# Patient Record
Sex: Male | Born: 1980 | Race: White | Hispanic: No | Marital: Married | Smoking: Never smoker
Health system: Southern US, Community
[De-identification: ages and names within clinical notes are randomized; demographics above are authoritative.]

## PROBLEM LIST (undated history)

## (undated) DIAGNOSIS — Z8669 Personal history of other diseases of the nervous system and sense organs: Secondary | ICD-10-CM

## (undated) DIAGNOSIS — E785 Hyperlipidemia, unspecified: Secondary | ICD-10-CM

## (undated) HISTORY — DX: Hyperlipidemia, unspecified: E78.5

## (undated) HISTORY — DX: Personal history of other diseases of the nervous system and sense organs: Z86.69

---
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
## 2017-07-11 LAB — LIPID PANEL
Cholesterol: 155 (ref 0–200)
HDL: 28 — AB (ref 35–70)
LDL Cholesterol: 101
Triglycerides: 130 (ref 40–160)

## 2017-07-11 LAB — HEMOGLOBIN A1C: Hemoglobin A1C: 5

## 2017-07-11 LAB — TESTOSTERONE: Testosterone: 253

## 2017-07-11 LAB — TESTOSTERONE, FREE: Free Testosterone: 6.7

## 2017-07-11 LAB — TSH: TSH: 2.59 (ref <=5.90)

## 2017-07-16 LAB — LUTEINIZING HORMONE: LH: 4.5

## 2017-07-16 LAB — CHG GONADOTROPIN FOLLICLE STIMULATING HORMONE: FSH: 6.2

## 2018-07-15 ENCOUNTER — Encounter: Payer: Self-pay | Admitting: Family Medicine

## 2018-07-15 ENCOUNTER — Other Ambulatory Visit: Payer: Self-pay

## 2018-07-15 ENCOUNTER — Ambulatory Visit: Payer: BC Managed Care – PPO | Admitting: Family Medicine

## 2018-07-15 VITALS — BP 122/84 | HR 80 | Temp 99.2°F | Resp 14 | Ht 71.5 in | Wt 252.5 lb

## 2018-07-15 DIAGNOSIS — H66015 Acute suppurative otitis media with spontaneous rupture of ear drum, recurrent, left ear: Secondary | ICD-10-CM | POA: Diagnosis not present

## 2018-07-15 MED ORDER — CIPROFLOXACIN HCL 0.2 % OT SOLN: 0.2000 mL | Freq: Two times a day (BID) | OTIC | 0 refills | Status: AC

## 2018-07-15 MED ORDER — CEFDINIR 300 MG PO CAPS: 600.0000 mg | ORAL_CAPSULE | Freq: Every day | ORAL | 0 refills | Status: DC

## 2018-07-15 NOTE — Progress Notes (Signed)
Subjective:     Thomas Weiss is a 38 y.o. male presenting for Establish Care (was seen PCP at [facility] but no longer can.) and Ear Pain (started on 07/10/2018. left ear. Went to [facility] on 07/12/2018 and was prescribed Augmentin. Pain is worse and having discharge now. No fever.)     Otalgia   There is pain in the left ear. This is a new problem. The current episode started in the past 7 days. The problem occurs constantly. The problem has been gradually worsening. There has been no fever. The pain is moderate. Associated symptoms include ear discharge (clear), headaches and hearing loss. Pertinent negatives include no abdominal pain, coughing, diarrhea, neck pain, rhinorrhea, sore throat or vomiting. He has tried antibiotics, heat packs, NSAIDs and acetaminophen for the symptoms. The treatment provided mild relief. ear infections as a child      Review of Systems  HENT: Positive for ear discharge (clear), ear pain and hearing loss. Negative for rhinorrhea and sore throat.   Respiratory: Negative for cough.   Gastrointestinal: Negative for abdominal pain, diarrhea and vomiting.  Musculoskeletal: Negative for neck pain.  Neurological: Positive for headaches.     Social History   Tobacco Use  Smoking Status Never Smoker  Smokeless Tobacco Never Used        Objective:    BP Readings from Last 3 Encounters:  07/15/18 122/84   Wt Readings from Last 3 Encounters:  07/15/18 252 lb 8 oz (114.5 kg)    BP 122/84   Pulse 80   Temp 99.2 F (37.3 C)   Resp 14   Ht 5' 11.5" (1.816 m)   Wt 252 lb 8 oz (114.5 kg)   SpO2 98%   BMI 34.73 kg/m    Physical Exam Constitutional:      General: He is not in acute distress.    Appearance: He is well-developed. He is not ill-appearing.  HENT:     Head: Normocephalic and atraumatic.     Right Ear: Ear canal normal. Tympanic membrane is scarred.     Left Ear: Ear canal normal. Drainage and tenderness present.   Ears:     Comments: Left ear canal with erythema. Drainage in the ear canal. Possible perforated ear drum, but partially obstructed by pus collection and exam was uncomfortable.     Nose: Mucosal edema and rhinorrhea present.     Right Sinus: No maxillary sinus tenderness or frontal sinus tenderness.     Left Sinus: No maxillary sinus tenderness or frontal sinus tenderness.     Mouth/Throat:     Pharynx: Uvula midline. Posterior oropharyngeal erythema present. No oropharyngeal exudate.     Tonsils: 0 on the right. 0 on the left.  Neck:     Musculoskeletal: Neck supple.  Cardiovascular:     Rate and Rhythm: Normal rate and regular rhythm.     Heart sounds: No murmur.  Pulmonary:     Effort: Pulmonary effort is normal. No respiratory distress.     Breath sounds: Normal breath sounds.  Lymphadenopathy:     Cervical: No cervical adenopathy.  Skin:    General: Skin is warm and dry.     Capillary Refill: Capillary refill takes less than 2 seconds.  Neurological:     Mental Status: He is alert.           Assessment & Plan:   Problem List Items Addressed This Visit      Nervous and Auditory  Acute suppurative otitis media with spontaneous rupture of ear drum, recurrent, left ear - Primary    No improvement on Augmentin will change to cefdinir. Suspect likely perforated ear drum with otitis externa given erythema and tenderness. Drops also prescribed. Discussed avoiding water and could return for recheck if planning to do swimming in the summer.       Relevant Medications   amoxicillin-clavulanate (AUGMENTIN) 875-125 MG tablet   cefdinir (OMNICEF) 300 MG capsule   Ciprofloxacin HCl 0.2 % otic solution       Return if symptoms worsen or fail to improve.  Lynnda Child, MD

## 2018-07-15 NOTE — Patient Instructions (Signed)
Otitis media with likely external ear infection as well  Use a cottonball coated in Vaseline when you shower to avoid getting extra water in the ear until it is improved. Would also avoid swimming or underwater activity for a few weeks.

## 2018-07-15 NOTE — Assessment & Plan Note (Signed)
No improvement on Augmentin will change to cefdinir. Suspect likely perforated ear drum with otitis externa given erythema and tenderness. Drops also prescribed. Discussed avoiding water and could return for recheck if planning to do swimming in the summer.

## 2018-07-23 ENCOUNTER — Telehealth: Payer: Self-pay

## 2018-07-23 NOTE — Telephone Encounter (Signed)
Left message for patient to call back. Received reply from [facility] office in regards to our request for records and they sent over their own forms to be filled out and signed. Need to see if patient can come by at some point and do this if able.

## 2018-07-27 NOTE — Telephone Encounter (Signed)
Pt returned call

## 2018-07-29 NOTE — Telephone Encounter (Signed)
Spoke with Thomas Weiss.  He will stop by office today to sign record release.

## 2018-09-02 ENCOUNTER — Ambulatory Visit: Payer: BC Managed Care – PPO | Admitting: Family Medicine

## 2018-09-02 ENCOUNTER — Other Ambulatory Visit: Payer: Self-pay

## 2018-09-02 ENCOUNTER — Encounter: Payer: Self-pay | Admitting: Family Medicine

## 2018-09-02 DIAGNOSIS — H6091 Unspecified otitis externa, right ear: Secondary | ICD-10-CM | POA: Insufficient documentation

## 2018-09-02 DIAGNOSIS — H60311 Diffuse otitis externa, right ear: Secondary | ICD-10-CM | POA: Diagnosis not present

## 2018-09-02 MED ORDER — CIPROFLOXACIN HCL 0.2 % OT SOLN: 0.2000 mL | Freq: Two times a day (BID) | OTIC | 0 refills | Status: DC

## 2018-09-02 NOTE — Assessment & Plan Note (Signed)
Treat with cipro ear drops, tylenol PRN pain.  Update if not improving with treatment for ENT referral - to consider antifungal treatment.  rec avoid swimming until fully healed. Pt agrees with plan.

## 2018-09-02 NOTE — Progress Notes (Signed)
This visit was conducted in person.  BP 118/62 (BP Location: Left Arm, Patient Position: Sitting, Cuff Size: Large)   Pulse 66   Temp 97.8 F (36.6 C) (Tympanic)   Ht 5' 11.5" (1.816 m)   Wt 251 lb 3 oz (113.9 kg)   SpO2 98%   BMI 34.55 kg/m    CC: ear infection Subjective:    Patient ID: Thomas Weiss, male    DOB: [birth year], 38 y.o.   MRN: [id number]  HPI: Thomas Weiss is a 38 y.o. male presenting on 09/02/2018 for Ear Pain (C/o right ear pain and drainage. Pain started about 2 wks ago. )   2 wk h/o R ear pressure that turned into pain. Tenderness stopped 2 days later and since has been draining fluid. Persistent muffled hearing, hearing loss, itching of ear. No tinnitus. No recent swimming. No nasal congestion, sinus pain, ST, cough, sneezing. No fevers/chills.    H/o L acute otitis media with possible TM perf 06/2018 treated initially with augmentin, changed to cefdinir. L ear symptoms fully resolved.      Relevant past medical, surgical, family and social history reviewed and updated as indicated. Interim medical history since our last visit reviewed. Allergies and medications reviewed and updated. Outpatient Medications Prior to Visit  Medication Sig Dispense Refill  . sildenafil (VIAGRA) 100 MG tablet Take by mouth.    Marland Kitchen amoxicillin-clavulanate (AUGMENTIN) 875-125 MG tablet      No facility-administered medications prior to visit.      Per HPI unless specifically indicated in ROS section below Review of Systems Objective:    BP 118/62 (BP Location: Left Arm, Patient Position: Sitting, Cuff Size: Large)   Pulse 66   Temp 97.8 F (36.6 C) (Tympanic)   Ht 5' 11.5" (1.816 m)   Wt 251 lb 3 oz (113.9 kg)   SpO2 98%   BMI 34.55 kg/m   Wt Readings from Last 3 Encounters:  09/02/18 251 lb 3 oz (113.9 kg)  07/15/18 252 lb 8 oz (114.5 kg)    Physical Exam Vitals signs and nursing note reviewed.  Constitutional:      General: He is not in acute distress.  Appearance: Normal appearance. He is not ill-appearing.  HENT:     Head: Normocephalic and atraumatic.     Right Ear: Decreased hearing noted. Drainage (yellow discharge present L ear canal) and tenderness present. No mastoid tenderness.     Left Ear: Hearing, tympanic membrane, ear canal and external ear normal.     Ears:     Comments: Unable to evaluate R TM due to discharge present    Nose: Nose normal.     Mouth/Throat:     Mouth: Mucous membranes are moist.     Pharynx: Oropharynx is clear. No oropharyngeal exudate or posterior oropharyngeal erythema.  Eyes:     Extraocular Movements: Extraocular movements intact.     Conjunctiva/sclera: Conjunctivae normal.     Pupils: Pupils are equal, round, and reactive to light.  Neurological:     Mental Status: He is alert.        Assessment & Plan:   Problem List Items Addressed This Visit    External otitis of right ear    Treat with cipro ear drops, tylenol PRN pain.  Update if not improving with treatment for ENT referral - to consider antifungal treatment.  rec avoid swimming until fully healed. Pt agrees with plan.           Meds ordered this  encounter  Medications  . Ciprofloxacin HCl 0.2 % otic solution    Sig: Place 0.2 mLs into the right ear 2 (two) times daily.    Dispense:  14 vial    Refill:  0   No orders of the defined types were placed in this encounter.   Follow up plan: Return if symptoms worsen or fail to improve.  Eustaquio BoydenJavier Deneen Slager, MD

## 2018-09-02 NOTE — Patient Instructions (Signed)
You have external ear infection. Treat with ear drops sent to pharmacy. May use tylenol for discomfort as needed.  Let us know if not improving with treatment for ENT evaluation.  Limit swimming for now.   Otitis Externa  Otitis externa is an infection of the outer ear canal. The outer ear canal is the area between the outside of the ear and the eardrum. Otitis externa is sometimes called swimmer's ear. What are the causes? Common causes of this condition include:  Swimming in dirty water.  Moisture in the ear.  An injury to the inside of the ear.  An object stuck in the ear.  A cut or scrape on the outside of the ear. What increases the risk? You are more likely to develop this condition if you go swimming often. What are the signs or symptoms? The first symptom of this condition is often itching in the ear. Later symptoms of the condition include:  Swelling of the ear.  Redness in the ear.  Ear pain. The pain may get worse when you pull on your ear.  Pus coming from the ear. How is this diagnosed? This condition may be diagnosed by examining the ear and testing fluid from the ear for bacteria and funguses. How is this treated? This condition may be treated with:  Antibiotic ear drops. These are often given for 10-14 days.  Medicines to reduce itching and swelling. Follow these instructions at home:  If you were prescribed antibiotic ear drops, use them as told by your health care provider. Do not stop using the antibiotic even if your condition improves.  Take over-the-counter and prescription medicines only as told by your health care provider.  Avoid getting water in your ears as told by your health care provider. This may include avoiding swimming or water sports for a few days.  Keep all follow-up visits as told by your health care provider. This is important. How is this prevented?  Keep your ears dry. Use the corner of a towel to dry your ears after you swim  or bathe.  Avoid scratching or putting things in your ear. Doing these things can damage the ear canal or remove the protective wax that lines it, which makes it easier for bacteria and funguses to grow.  Avoid swimming in lakes, polluted water, or pools that may not have enough chlorine. Contact a health care provider if:  You have a fever.  Your ear is still red, swollen, painful, or draining pus after 3 days.  Your redness, swelling, or pain gets worse.  You have a severe headache.  You have redness, swelling, pain, or tenderness in the area behind your ear. Summary  Otitis externa is an infection of the outer ear canal.  Common causes include swimming in dirty water, moisture in the ear, or a cut or scrape in the ear.  Symptoms include pain, redness, and swelling of the ear.  If you were prescribed antibiotic ear drops, use them as told by your health care provider. Do not stop using the antibiotic even if your condition improves. This information is not intended to replace advice given to you by your health care provider. Make sure you discuss any questions you have with your health care provider. Document Released: 02/11/2005 Document Revised: 07/18/2017 Document Reviewed: 07/18/2017 Elsevier Patient Education  2020 [employer].

## 2018-11-20 ENCOUNTER — Encounter: Payer: Self-pay | Admitting: Family Medicine

## 2018-11-20 ENCOUNTER — Other Ambulatory Visit: Payer: Self-pay

## 2018-11-20 ENCOUNTER — Ambulatory Visit: Payer: BC Managed Care – PPO | Admitting: Family Medicine

## 2018-11-20 ENCOUNTER — Ambulatory Visit (INDEPENDENT_AMBULATORY_CARE_PROVIDER_SITE_OTHER)
Admission: RE | Admit: 2018-11-20 | Discharge: 2018-11-20 | Disposition: A | Discharge status: Home / Self Care | Payer: BC Managed Care – PPO | Source: Ambulatory Visit | Attending: Family Medicine | Admitting: Family Medicine

## 2018-11-20 VITALS — BP 110/70 | HR 84 | Temp 98.3°F | Ht 71.5 in | Wt 242.4 lb

## 2018-11-20 DIAGNOSIS — Z23 Encounter for immunization: Secondary | ICD-10-CM

## 2018-11-20 DIAGNOSIS — M79642 Pain in left hand: Secondary | ICD-10-CM

## 2018-11-20 MED ORDER — MELOXICAM 15 MG PO TABS: 15.0000 mg | ORAL_TABLET | Freq: Every day | ORAL | 0 refills | Status: DC

## 2018-11-20 NOTE — Progress Notes (Signed)
Subjective:    Patient ID: Thomas Weiss, male    DOB: 1980-03-04, 38 y.o.   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 Left hand pain 11/20/2018  . External otitis of right ear 09/02/2018  . Acute suppurative otitis media with spontaneous rupture of ear drum, recurrent, left ear 07/15/2018   Past Medical History:  Diagnosis Date  . History of migraine   . Hyperlipidemia    in high school   Past Surgical History:  Procedure Laterality Date  . PILONIDAL CYST EXCISION  2000   Social History   Tobacco Use  . Smoking status: Never Smoker  . Smokeless tobacco: Never Used  Substance Use Topics  . Alcohol use: Never    Frequency: Never  . Drug use: Never   Family History  Problem Relation Age of Onset  . Cancer Mother        metastatic at the time of diagnoses  . Skin cancer Father        not melanoma  . Heart disease Father 4       had triple bypass 2015  . COPD Father   . Hypertension Father   . Alcohol abuse Father   . Depression Brother   . Drug abuse Brother        illicit drugs  . Dementia Maternal Grandmother   . Stroke Maternal Grandmother 80  . Prostate cancer Maternal Grandfather 80  . Skin cancer Maternal Grandfather    not melanoma  . Heart disease Paternal Grandfather   . Skin cancer Paternal Grandfather        not melanoma  . Heart disease Maternal Aunt   . Stroke Maternal Aunt    Allergies  Allergen Reactions  . Orange Oil Other (See Comments)    Migraine headache if drinks a lot of OJ   Current Outpatient Medications on File Prior to Visit  Medication Sig Dispense Refill  . sildenafil (VIAGRA) 100 MG tablet Take by mouth.     No current facility-administered medications on file prior to visit.      Review of Systems  Constitutional: Negative for activity change, appetite change, fatigue, fever and unexpected weight change.  HENT: Negative for congestion, rhinorrhea, sore throat and trouble swallowing.   Eyes: Negative for pain, redness, itching and visual disturbance.  Respiratory: Negative for cough, chest tightness, shortness of breath and wheezing.   Cardiovascular: Negative for chest pain and palpitations.  Gastrointestinal: Negative for abdominal pain, blood in stool, constipation, diarrhea and nausea.  Endocrine: Negative for cold  intolerance, heat intolerance, polydipsia and polyuria.  Genitourinary: Negative for difficulty urinating, dysuria, frequency and urgency.  Musculoskeletal: Negative for arthralgias, joint swelling and myalgias.       L hand pain  Skin: Negative for pallor and rash.  Neurological: Negative for dizziness, tremors, weakness, numbness and headaches.  Hematological: Negative for adenopathy. Does not bruise/bleed easily.  Psychiatric/Behavioral: Negative for decreased concentration and dysphoric mood. The patient is not nervous/anxious.        Objective:   Physical Exam Constitutional:      General: He is not in acute distress.    Appearance: Normal appearance. He is obese. He is not ill-appearing.  HENT:     Head: Normocephalic and atraumatic.  Cardiovascular:     Rate and Rhythm: Normal rate and regular rhythm.  Pulmonary:     Effort: Pulmonary effort  is normal. No respiratory distress.     Breath sounds: Normal breath sounds. No wheezing.  Musculoskeletal:     Left hand: He exhibits tenderness. He exhibits normal capillary refill, no deformity and no swelling. Normal sensation noted. Normal strength noted.     Comments: Tenderness at base of L thumb dorsally  Also lateral wrist  Worse to pronate and flex wrist  No swelling  No neuro changes No crepitus  Neg Finkelstein's test    Skin:    General: Skin is warm and dry.     Findings: No erythema or rash.  Neurological:     Mental Status: He is alert.     Sensory: No sensory deficit.     Motor: No weakness.     Coordination: Coordination normal.     Deep Tendon Reflexes: Reflexes normal.  Psychiatric:        Mood and Affect: Mood normal.           Assessment & Plan:   Problem List Items Addressed This Visit      Other   Left hand pain    Suspect tendonitis from repeated  movement at home and work with L thumb (poss extensor tendonitis of thumb and /or wrist)  Recommend pressure wrap  Ice prn Relative rest meloxicam 15 mg qd prn  Xray today to r/o bony injury  Update if not starting to improve in 2 weeks or if worsening  (consider sport med consult)       Relevant Orders   DG Hand Complete Left (Completed)    Other Visit Diagnoses    Need for influenza vaccination    -  Primary   Relevant Orders   Flu Vaccine QUAD 6+ mos PF IM (Fluarix Quad PF) (Completed)      Problem List Items Addressed This Visit      Other   Left hand pain    Suspect tendonitis from repeated  movement at home and work with L thumb (poss extensor tendonitis of thumb and /or wrist)  Recommend pressure wrap  Ice prn Relative rest meloxicam 15 mg qd prn  Xray today to r/o bony injury  Update if not starting to improve in 2 weeks or if worsening  (consider sport med consult)       Relevant Orders   DG Hand Complete Left (Completed)    Other Visit Diagnoses    Need for influenza  vaccination    -  Primary   Relevant Orders   Flu Vaccine QUAD 6+ mos PF IM (Fluarix Quad PF) (Completed)

## 2018-11-20 NOTE — Patient Instructions (Signed)
Try a wrist splint/brace to see if that gives you more support/compression  Wear it at night and as needed Avoid the movements that make things hurt more (when able)  If worse let us know  Xray now- we will contact you with a result Take meloxciam 15 mg daily with food for 2 weeks   If not better in 2 weeks call us and let us know

## 2018-11-21 NOTE — Assessment & Plan Note (Signed)
Suspect tendonitis from repeated  movement at home and work with L thumb (poss extensor tendonitis of thumb and /or wrist)  Recommend pressure wrap  Ice prn Relative rest meloxicam 15 mg qd prn  Xray today to r/o bony injury  Update if not starting to improve in 2 weeks or if worsening  (consider sport med consult)

## 2019-01-15 ENCOUNTER — Encounter: Payer: Self-pay | Admitting: Family Medicine

## 2019-01-15 ENCOUNTER — Other Ambulatory Visit: Payer: Self-pay

## 2019-01-15 ENCOUNTER — Ambulatory Visit: Payer: BC Managed Care – PPO | Admitting: Family Medicine

## 2019-01-15 VITALS — BP 116/74 | HR 66 | Temp 97.8°F | Ht 71.5 in | Wt 242.0 lb

## 2019-01-15 DIAGNOSIS — H66015 Acute suppurative otitis media with spontaneous rupture of ear drum, recurrent, left ear: Secondary | ICD-10-CM | POA: Diagnosis not present

## 2019-01-15 MED ORDER — CEFDINIR 300 MG PO CAPS: 300.0000 mg | ORAL_CAPSULE | Freq: Two times a day (BID) | ORAL | 0 refills | Status: DC

## 2019-01-15 NOTE — Patient Instructions (Signed)
Take the cefdinir as directed  Alert [place] if symptoms worsen Ibuprofen is ok to use  If hearing does not improve after treatment - the follow up with Dr Einar Pheasant to re check ear

## 2019-01-15 NOTE — Progress Notes (Signed)
Subjective:    Patient ID: Thomas Weiss, male    DOB: 11-16-1980, 38 y.o.   MRN: [id number]  This visit occurred during the SARS-CoV-2 public health emergency.  Safety protocols were in place, including screening questions prior to the visit, additional usage of staff PPE, and extensive cleaning of exam room while observing appropriate contact time as indicated for disinfecting solutions.    HPI 38 yo pt of Dr Einar Pheasant here for left ear pain   Has had recurrent OM in L ear in the past Bad in may- with perforation   This started 2 wk ago  Itching and white d/c from ear  No swelling but felt raw   Now less drainage Cannot hear well  Sensitive -not severely painful   No swimming  Does tend to get water in ear in the shower   Other ear is fine  No fever   occ tinnitis-not often   2 wk ago had uri- (tested neg for covid ) That got better   Does not take any allergy medicines   Has taken advil once   Patient Active Problem List   Diagnosis Date Noted  . Left hand pain 11/20/2018  . External otitis of right ear 09/02/2018  . Acute suppurative otitis media with spontaneous rupture of ear drum, recurrent, left ear 07/15/2018   Past Medical History:  Diagnosis Date  . History of migraine   . Hyperlipidemia    in high school   Past Surgical History:  Procedure Laterality Date  . PILONIDAL CYST EXCISION  2000   Social History   Tobacco Use  . Smoking status: Never Smoker  . Smokeless tobacco: Never Used  Substance Use Topics  . Alcohol use: Never    Frequency: Never  . Drug use: Never   Family History  Problem Relation Age of Onset  . Cancer Mother        metastatic at the time of diagnoses  . Skin cancer Father        not melanoma  . Heart disease Father 29       had triple bypass 2015  . COPD Father   . Hypertension Father   . Alcohol abuse Father   . Depression Brother   . Drug abuse Brother        illicit drugs  . Dementia Maternal Grandmother   .  Stroke Maternal Grandmother 80  . Prostate cancer Maternal Grandfather 80  . Skin cancer Maternal Grandfather        not melanoma  . Heart disease Paternal Grandfather   . Skin cancer Paternal Grandfather        not melanoma  . Heart disease Maternal Aunt   . Stroke Maternal Aunt    Allergies  Allergen Reactions  . Orange Oil Other (See Comments)    Migraine headache if drinks a lot of OJ   Current Outpatient Medications on File Prior to Visit  Medication Sig Dispense Refill  . sildenafil (VIAGRA) 100 MG tablet Take by mouth.     No current facility-administered medications on file prior to visit.     Review of Systems  Constitutional: Negative for activity change, appetite change, fatigue, fever and unexpected weight change.  HENT: Positive for ear discharge, ear pain and hearing loss. Negative for congestion, rhinorrhea, sore throat and trouble swallowing.   Eyes: Negative for pain, redness, itching and visual disturbance.  Respiratory: Negative for cough, chest tightness, shortness of breath and wheezing.   Cardiovascular: Negative for  chest pain and palpitations.  Gastrointestinal: Negative for abdominal pain, blood in stool, constipation, diarrhea and nausea.  Endocrine: Negative for cold intolerance, heat intolerance, polydipsia and polyuria.  Genitourinary: Negative for difficulty urinating, dysuria, frequency and urgency.  Musculoskeletal: Negative for arthralgias, joint swelling and myalgias.  Skin: Negative for pallor and rash.  Neurological: Negative for dizziness, tremors, weakness, numbness and headaches.  Hematological: Negative for adenopathy. Does not bruise/bleed easily.  Psychiatric/Behavioral: Negative for decreased concentration and dysphoric mood. The patient is not nervous/anxious.        Objective:   Physical Exam Constitutional:      General: He is not in acute distress.    Appearance: Normal appearance. He is obese. He is not ill-appearing.  HENT:      Head: Normocephalic and atraumatic.     Right Ear: Tympanic membrane, ear canal and external ear normal. There is no impacted cerumen.     Ears:     Comments: L ear canal has mucous/pale in color No redness or swelling of canal or pinna  No external tenderness No mastoid tenderness Dec hearing to soft sound on L side     Mouth/Throat:     Mouth: Mucous membranes are moist.     Pharynx: Oropharynx is clear.  Eyes:     General:        Right eye: No discharge.        Left eye: No discharge.     Extraocular Movements: Extraocular movements intact.     Conjunctiva/sclera: Conjunctivae normal.     Pupils: Pupils are equal, round, and reactive to light.  Neck:     Musculoskeletal: Normal range of motion and neck supple. No muscular tenderness.  Cardiovascular:     Rate and Rhythm: Normal rate and regular rhythm.  Pulmonary:     Effort: Pulmonary effort is normal. No respiratory distress.     Breath sounds: Normal breath sounds. No wheezing or rales.  Lymphadenopathy:     Cervical: No cervical adenopathy.  Skin:    General: Skin is warm and dry.     Findings: No erythema or rash.  Neurological:     Mental Status: He is alert.     Cranial Nerves: No cranial nerve deficit.  Psychiatric:        Mood and Affect: Mood normal.           Assessment & Plan:   Problem List Items Addressed This Visit      Nervous and Auditory   Acute suppurative otitis media with spontaneous rupture of ear drum, recurrent, left ear - Primary    Suspect this is repeat of last episode but not as severe Pt has mucous in ear canal with pain tx with cefdinir which worked last time Update if not starting to improve in a week or if worsening   Needs close f/u if not improved with restoration of hearing after abx course        Relevant Medications   cefdinir (OMNICEF) 300 MG capsule

## 2019-01-17 NOTE — Assessment & Plan Note (Signed)
Suspect this is repeat of last episode but not as severe Pt has mucous in ear canal with pain tx with cefdinir which worked last time Update if not starting to improve in a week or if worsening   Needs close f/u if not improved with restoration of hearing after abx course

## 2019-01-20 ENCOUNTER — Encounter: Payer: Self-pay | Admitting: Family Medicine

## 2019-01-20 DIAGNOSIS — H66015 Acute suppurative otitis media with spontaneous rupture of ear drum, recurrent, left ear: Secondary | ICD-10-CM

## 2019-01-20 NOTE — Telephone Encounter (Signed)
Please call pt  Urgent ENT ref Is Dr Verda Cumins pt but I saw him for the problem   Will route to [facility] and cc to pCP

## 2019-11-11 ENCOUNTER — Other Ambulatory Visit: Payer: Self-pay

## 2019-11-11 ENCOUNTER — Ambulatory Visit: Payer: BC Managed Care – PPO | Admitting: Family Medicine

## 2019-11-11 ENCOUNTER — Encounter: Payer: Self-pay | Admitting: Family Medicine

## 2019-11-11 VITALS — BP 120/72 | HR 63 | Temp 97.8°F | Wt 236.8 lb

## 2019-11-11 DIAGNOSIS — N529 Male erectile dysfunction, unspecified: Secondary | ICD-10-CM | POA: Diagnosis not present

## 2019-11-11 DIAGNOSIS — H6501 Acute serous otitis media, right ear: Secondary | ICD-10-CM

## 2019-11-11 DIAGNOSIS — Z23 Encounter for immunization: Secondary | ICD-10-CM | POA: Diagnosis not present

## 2019-11-11 MED ORDER — SILDENAFIL CITRATE 100 MG PO TABS: 100.0000 mg | ORAL_TABLET | ORAL | 0 refills | Status: AC | PRN

## 2019-11-11 MED ORDER — AMOXICILLIN-POT CLAVULANATE 875-125 MG PO TABS: 1.0000 | ORAL_TABLET | Freq: Two times a day (BID) | ORAL | 0 refills | Status: DC

## 2019-11-11 NOTE — Progress Notes (Signed)
   Subjective:     Mina Carlisi is a 39 y.o. male presenting for Ear Pain (R x 4 days )     HPI  #Right ear pain - hx of fungal ear infection - started 4 days ago - no recent swimming - pain comes and goes - no treatment yet - did use heating pad  Review of Systems  Constitutional: Negative for chills and fever.  HENT: Positive for ear pain. Negative for congestion, ear discharge, postnasal drip and rhinorrhea.      Social History   Tobacco Use  Smoking Status Never Smoker  Smokeless Tobacco Never Used        Objective:    BP Readings from Last 3 Encounters:  11/11/19 120/72  01/15/19 116/74  11/20/18 110/70   Wt Readings from Last 3 Encounters:  11/11/19 236 lb 12 oz (107.4 kg)  01/15/19 242 lb (109.8 kg)  11/20/18 242 lb 7 oz (110 kg)    BP 120/72   Pulse 63   Temp 97.8 F (36.6 C) (Temporal)   Wt 236 lb 12 oz (107.4 kg)   SpO2 98%   BMI 32.56 kg/m    Physical Exam Constitutional:      Appearance: Normal appearance. He is not ill-appearing or diaphoretic.  HENT:     Right Ear: Ear canal and external ear normal. A middle ear effusion is present. Tympanic membrane is bulging. Tympanic membrane is not injected.     Left Ear: Tympanic membrane, ear canal and external ear normal.     Nose: Nose normal.  Eyes:     General: No scleral icterus.    Extraocular Movements: Extraocular movements intact.     Conjunctiva/sclera: Conjunctivae normal.  Cardiovascular:     Rate and Rhythm: Normal rate.  Pulmonary:     Effort: Pulmonary effort is normal.  Musculoskeletal:     Cervical back: Neck supple.  Skin:    General: Skin is warm and dry.  Neurological:     Mental Status: He is alert. Mental status is at baseline.  Psychiatric:        Mood and Affect: Mood normal.           Assessment & Plan:   Problem List Items Addressed This Visit      Other   Erectile dysfunction - Primary    Requests refill of medication. Works well. BP normal. Cont  prn viagra      Relevant Medications   sildenafil (VIAGRA) 100 MG tablet    Other Visit Diagnoses    Need for influenza vaccination       Relevant Orders   Flu Vaccine QUAD 36+ mos IM (Completed)   Non-recurrent acute serous otitis media of right ear       Relevant Medications   amoxicillin-clavulanate (AUGMENTIN) 875-125 MG tablet     Pt with hx of ear infection and right ear pain and consistent with infection. Treat with Abx. Given hx of recurrence would advise ENT if abx do not treat   Return if symptoms worsen or fail to improve.  Lynnda Child, MD  This visit occurred during the SARS-CoV-2 public health emergency.  Safety protocols were in place, including screening questions prior to the visit, additional usage of staff PPE, and extensive cleaning of exam room while observing appropriate contact time as indicated for disinfecting solutions.

## 2019-11-11 NOTE — Assessment & Plan Note (Signed)
Requests refill of medication. Works well. BP normal. Cont prn viagra

## 2019-11-11 NOTE — Patient Instructions (Signed)
augmentin x 10 days  If no improvement would recommend ENT or return

## 2019-11-21 ENCOUNTER — Encounter: Payer: Self-pay | Admitting: Family Medicine

## 2019-11-21 DIAGNOSIS — H6501 Acute serous otitis media, right ear: Secondary | ICD-10-CM

## 2019-11-21 DIAGNOSIS — H60311 Diffuse otitis externa, right ear: Secondary | ICD-10-CM

## 2020-06-04 IMAGING — DX DG HAND COMPLETE 3+V*L*
3 series · 3 of 3 positions shown · non-contrast
Comparison: None.

CLINICAL DATA: pain in left hand /primarily base of thumb no trauma

EXAM:
LEFT HAND - COMPLETE 3+ VIEW

[hand ap]
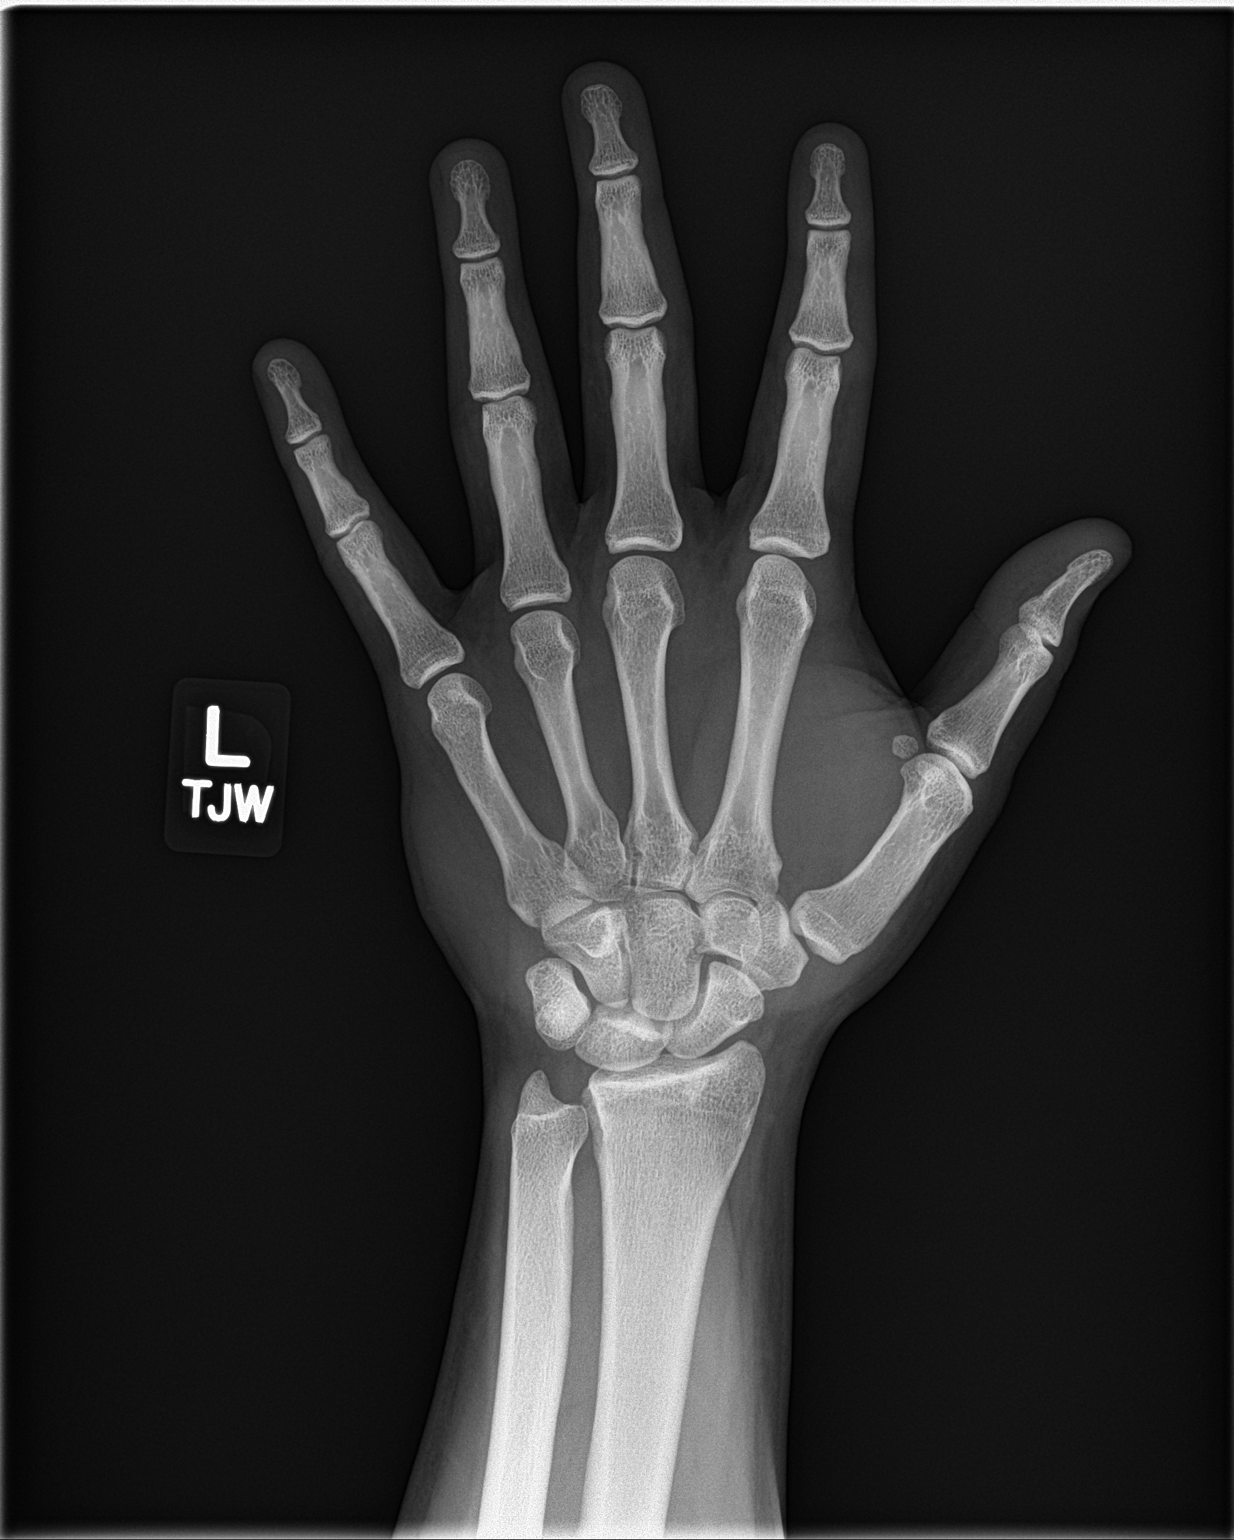

[hand obl]
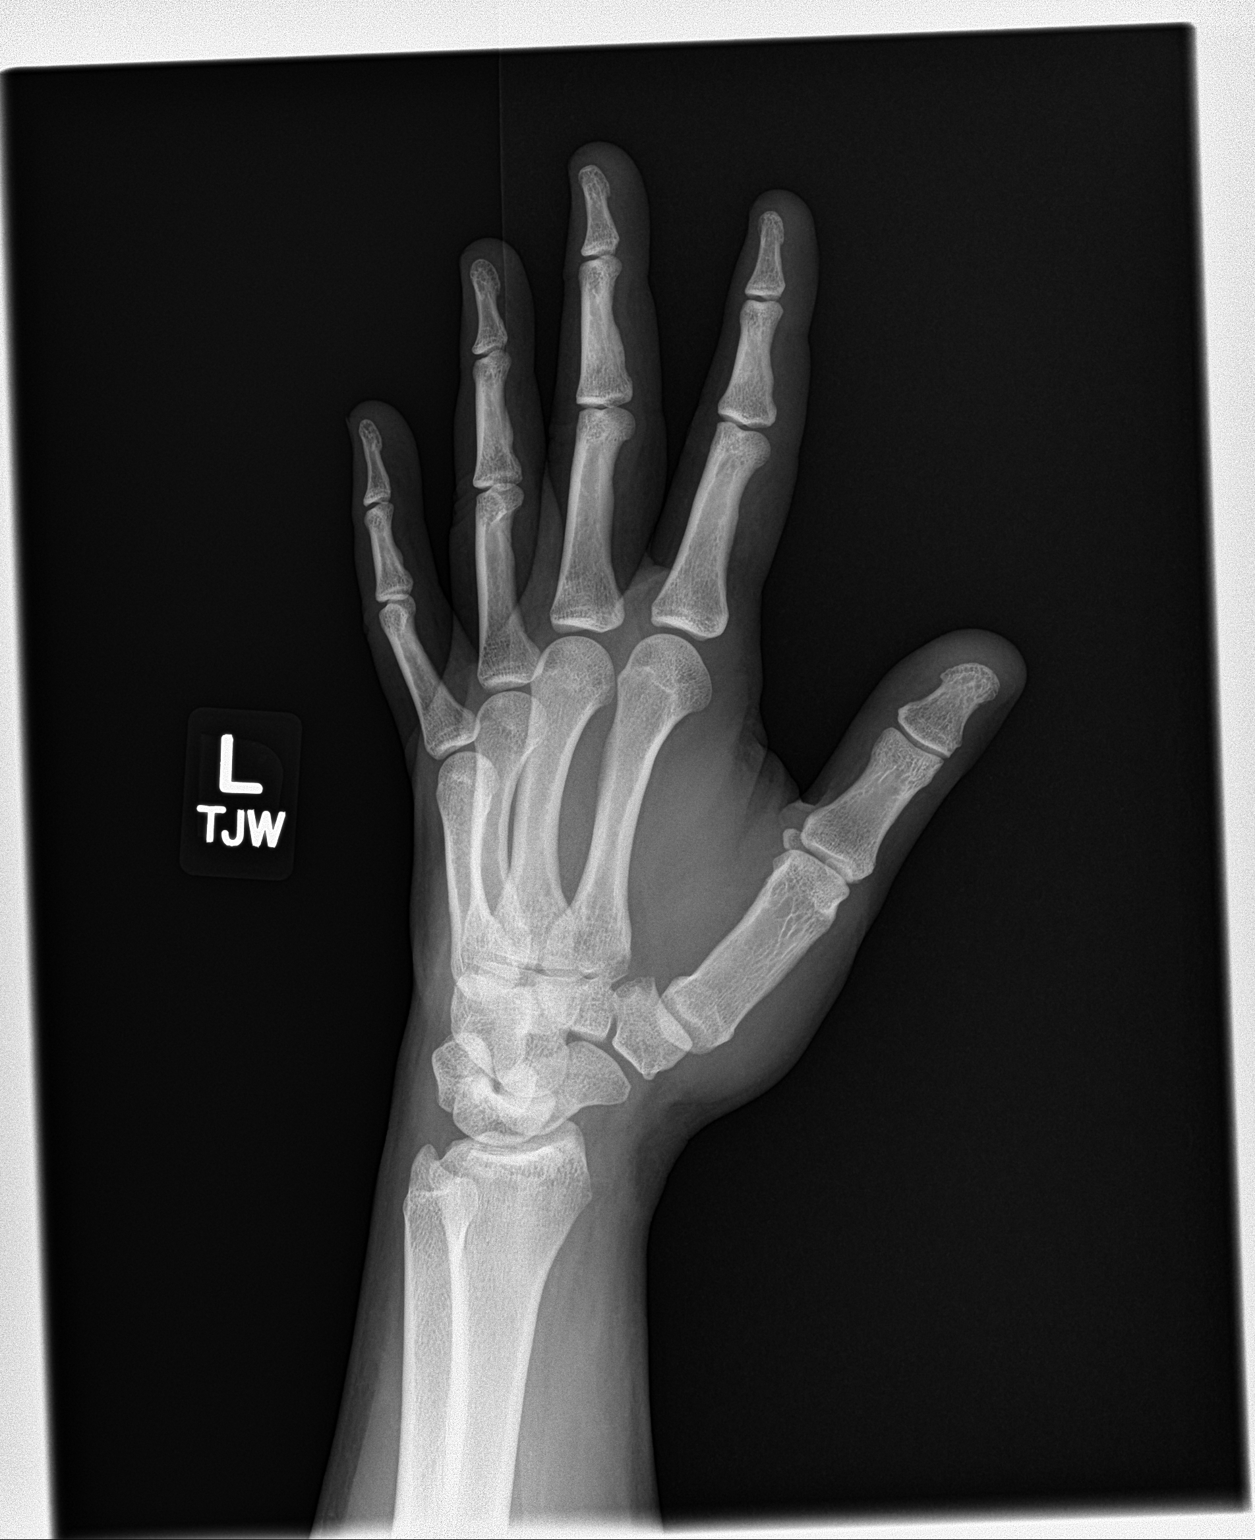

[hand lat]
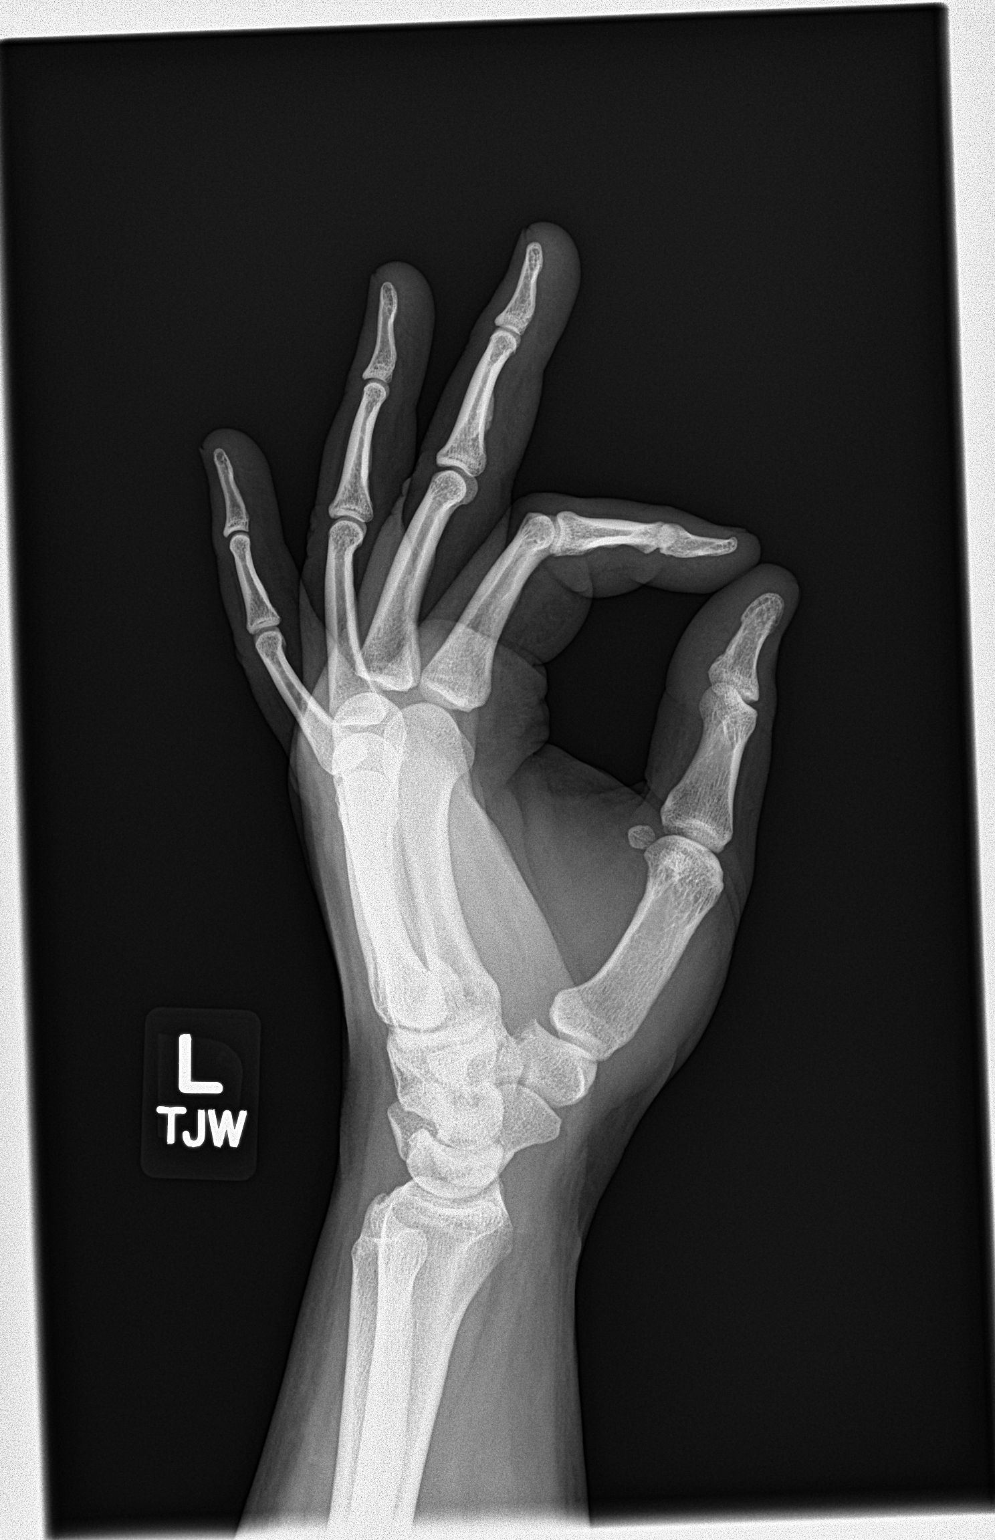

[3 of 3 positions shown; findings below may reference images not displayed]

FINDINGS: There is no evidence of fracture or dislocation. There is no
evidence of arthropathy or other focal bone abnormality. Soft
tissues are unremarkable.
IMPRESSION: No acute osseous abnormality in the left hand.

## 2021-06-16 ENCOUNTER — Other Ambulatory Visit: Payer: Self-pay | Admitting: Family Medicine

## 2021-06-16 DIAGNOSIS — N529 Male erectile dysfunction, unspecified: Secondary | ICD-10-CM

## 2021-06-24 ENCOUNTER — Encounter: Payer: Self-pay | Admitting: Family Medicine

## 2022-02-13 ENCOUNTER — Ambulatory Visit
Admission: EM | Admit: 2022-02-13 | Discharge: 2022-02-13 | Disposition: A | Discharge status: Home / Self Care | Payer: BC Managed Care – PPO | Attending: Emergency Medicine | Admitting: Emergency Medicine

## 2022-02-13 DIAGNOSIS — J020 Streptococcal pharyngitis: Secondary | ICD-10-CM

## 2022-02-13 LAB — POCT RAPID STREP A (OFFICE): Rapid Strep A Screen: POSITIVE — AB

## 2022-02-13 MED ORDER — AMOXICILLIN 500 MG PO CAPS: 500.0000 mg | ORAL_CAPSULE | Freq: Two times a day (BID) | ORAL | 0 refills | Status: AC

## 2022-02-13 NOTE — ED Triage Notes (Signed)
Patient to Urgent Care with complaints of sore throat that started last night. Painful to swallow.  Unknown fevers.

## 2022-02-13 NOTE — Discharge Instructions (Addendum)
Take the amoxicillin as directed.  Follow up with your primary care provider if your symptoms are not improving.   ° ° °

## 2022-02-13 NOTE — ED Provider Notes (Signed)
Thomas Weiss    CSN: [id number] Arrival date & time: 02/13/22  1351      History   Chief Complaint Chief Complaint  Patient presents with   Sore Throat    Entered by patient    HPI Thomas Weiss is a 41 y.o. male.  Patient presents with 1 day history of sore throat.  He denies fever, rash, cough, shortness of breath, vomiting, diarrhea, or other symptoms.  Treatment at home with Tylenol.  The history is provided by the patient and medical records.    Past Medical History:  Diagnosis Date   History of migraine    Hyperlipidemia    in high school    Patient Active Problem List   Diagnosis Date Noted   Erectile dysfunction 11/11/2019   Left hand pain 11/20/2018    Past Surgical History:  Procedure Laterality Date   PILONIDAL CYST EXCISION  2000       Home Medications    Prior to Admission medications   Medication Sig Start Date End Date Taking? Authorizing Provider  amoxicillin (AMOXIL) 500 MG capsule Take 1 capsule (500 mg total) by mouth 2 (two) times daily for 10 days. 02/13/22 02/23/22 Yes Sharion Balloon, NP  sildenafil (VIAGRA) 100 MG tablet Take 1 tablet (100 mg total) by mouth as needed for erectile dysfunction. 11/11/19   Waunita Schooner, MD    Family History Family History  Problem Relation Age of Onset   Cancer Mother        metastatic at the time of diagnoses   Skin cancer Father        not melanoma   Heart disease Father 80       had triple bypass 2015   COPD Father    Hypertension Father    Alcohol abuse Father    Depression Brother    Drug abuse Brother        illicit drugs   Dementia Maternal Grandmother    Stroke Maternal Grandmother 80   Prostate cancer Maternal Grandfather 52   Skin cancer Maternal Grandfather        not melanoma   Heart disease Paternal Grandfather    Skin cancer Paternal Grandfather        not melanoma   Heart disease Maternal Aunt    Stroke Maternal Aunt     Social History Social History    Tobacco Use   Smoking status: Never   Smokeless tobacco: Never  Vaping Use   Vaping Use: Never used  Substance Use Topics   Alcohol use: Never   Drug use: Never     Allergies   Orange oil   Review of Systems Review of Systems  Constitutional:  Negative for chills and fever.  HENT:  Positive for sore throat. Negative for ear pain.   Respiratory:  Negative for cough and shortness of breath.   Cardiovascular:  Negative for chest pain and palpitations.  Gastrointestinal:  Negative for diarrhea and vomiting.  Skin:  Negative for color change and rash.  All other systems reviewed and are negative.    Physical Exam Triage Vital Signs ED Triage Vitals  Enc Vitals Group     BP 02/13/22 1457 134/88     Pulse Rate 02/13/22 1444 (!) 110     Resp 02/13/22 1444 18     Temp 02/13/22 1444 99.3 F (37.4 C)     Temp src --      SpO2 02/13/22 1444 98 %  Weight 02/13/22 1454 240 lb (108.9 kg)     Height 02/13/22 1454 5\' 11"  (1.803 m)     Head Circumference --      Peak Flow --      Pain Score 02/13/22 1446 5     Pain Loc --      Pain Edu? --      Excl. in GC? --    No data found.  Updated Vital Signs BP 134/88   Pulse (!) 110   Temp 99.3 F (37.4 C)   Resp 18   Ht 5\' 11"  (1.803 m)   Wt 240 lb (108.9 kg)   SpO2 98%   BMI 33.47 kg/m   Visual Acuity Right Eye Distance:   Left Eye Distance:   Bilateral Distance:    Right Eye Near:   Left Eye Near:    Bilateral Near:     Physical Exam Vitals and nursing note reviewed.  Constitutional:      General: He is not in acute distress.    Appearance: Normal appearance. He is well-developed. He is not ill-appearing.  HENT:     Head: Normocephalic and atraumatic.     Right Ear: Tympanic membrane normal.     Left Ear: Tympanic membrane normal.     Nose: Nose normal.     Mouth/Throat:     Mouth: Mucous membranes are moist.     Pharynx: Posterior oropharyngeal erythema present.  Cardiovascular:     Rate and Rhythm:  Normal rate and regular rhythm.     Heart sounds: Normal heart sounds.  Pulmonary:     Effort: Pulmonary effort is normal. No respiratory distress.     Breath sounds: Normal breath sounds.  Musculoskeletal:     Cervical back: Neck supple.  Skin:    General: Skin is warm and dry.  Neurological:     Mental Status: He is alert.  Psychiatric:        Mood and Affect: Mood normal.        Behavior: Behavior normal.      UC Treatments / Results  Labs (all labs ordered are listed, but only abnormal results are displayed) Labs Reviewed  POCT RAPID STREP A (OFFICE) - Abnormal; Notable for the following components:      Result Value   Rapid Strep A Screen Positive (*)    All other components within normal limits    EKG   Radiology No results found.  Procedures Procedures (including critical care time)  Medications Ordered in UC Medications - No data to display  Initial Impression / Assessment and Plan / UC Course  I have reviewed the triage vital signs and the nursing notes.  Pertinent labs & imaging results that were available during my care of the patient were reviewed by me and considered in my medical decision making (see chart for details).    Strep pharyngitis.  Rapid strep positive.  Treating with amoxicillin.  Discussed symptomatic treatment including Tylenol or ibuprofen, rest, hydration.  Instructed patient to follow up with PCP if symptoms are not improving.  He agrees to plan of care.   Final Clinical Impressions(s) / UC Diagnoses   Final diagnoses:  Strep pharyngitis     Discharge Instructions      Take the amoxicillin as directed.  Follow up with your primary care provider if your symptoms are not improving.        ED Prescriptions     Medication Sig Dispense Auth. Provider   amoxicillin (AMOXIL)  500 MG capsule Take 1 capsule (500 mg total) by mouth 2 (two) times daily for 10 days. 20 capsule Sharion Balloon, NP      PDMP not reviewed this  encounter.   Sharion Balloon, NP 02/13/22 1535

## 2022-12-18 ENCOUNTER — Encounter: Payer: Self-pay | Admitting: Family Medicine

## 2022-12-18 ENCOUNTER — Ambulatory Visit: Payer: BC Managed Care – PPO | Admitting: Family Medicine

## 2022-12-18 VITALS — BP 138/76 | HR 74 | Temp 98.6°F | Ht 73.0 in | Wt 249.4 lb

## 2022-12-18 DIAGNOSIS — H66001 Acute suppurative otitis media without spontaneous rupture of ear drum, right ear: Secondary | ICD-10-CM

## 2022-12-18 DIAGNOSIS — H9201 Otalgia, right ear: Secondary | ICD-10-CM

## 2022-12-18 MED ORDER — OFLOXACIN 0.3 % OT SOLN: 5.0000 [drp] | Freq: Every day | OTIC | 0 refills | Status: AC

## 2022-12-18 MED ORDER — AMOXICILLIN 500 MG PO CAPS: 1000.0000 mg | ORAL_CAPSULE | Freq: Two times a day (BID) | ORAL | 0 refills | Status: AC

## 2022-12-18 NOTE — Progress Notes (Signed)
Patient ID: Thomas Weiss, male    DOB: Jan 04, 1981, 42 y.o.   MRN: [id number]  This visit was conducted in person.  BP 138/76   Pulse 74   Temp 98.6 F (37 C) (Oral)   Ht 6\' 1"  (1.854 m)   Wt 249 lb 6.4 oz (113.1 kg)   SpO2 97%   BMI 32.90 kg/m    CC:  Chief Complaint  Patient presents with   Ear Pain    Right ear, the pain started about 2 days ago. Notices a little drainage    Subjective:   HPI: Thomas Weiss is a 42 y.o. male presenting on 12/18/2022 for Ear Pain (Right ear, the pain started about 2 days ago. Notices a little drainage)   New onset pain in right ear  2 days. Pressure and fullness in proceeding 1 week.  No hearing change, no vertigo.  Discharge.. white.   No congestion, no sneeze.  Occ headache.  No flu like symptoms.  No fever  No SOB, no wheezing.    Has history of ear issues..  recurrent ear infection.. bacterial and fungal.  No issues since 2001.  No ear tubes placed.   Using Advil for pain as needed.     Relevant past medical, surgical, family and social history reviewed and updated as indicated. Interim medical history since our last visit reviewed. Allergies and medications reviewed and updated. Outpatient Medications Prior to Visit  Medication Sig Dispense Refill   sildenafil (VIAGRA) 100 MG tablet Take 1 tablet (100 mg total) by mouth as needed for erectile dysfunction. 30 tablet 0   No facility-administered medications prior to visit.     Per HPI unless specifically indicated in ROS section below Review of Systems  Constitutional:  Negative for fatigue and fever.  HENT:  Negative for ear pain.   Eyes:  Negative for pain.  Respiratory:  Negative for cough and shortness of breath.   Cardiovascular:  Negative for chest pain, palpitations and leg swelling.  Gastrointestinal:  Negative for abdominal pain.  Genitourinary:  Negative for dysuria.  Musculoskeletal:  Negative for arthralgias.  Neurological:  Negative for syncope,  light-headedness and headaches.  Psychiatric/Behavioral:  Negative for dysphoric mood.    Objective:  BP 138/76   Pulse 74   Temp 98.6 F (37 C) (Oral)   Ht 6\' 1"  (1.854 m)   Wt 249 lb 6.4 oz (113.1 kg)   SpO2 97%   BMI 32.90 kg/m   Wt Readings from Last 3 Encounters:  12/18/22 249 lb 6.4 oz (113.1 kg)  02/13/22 240 lb (108.9 kg)  11/11/19 236 lb 12 oz (107.4 kg)      Physical Exam Constitutional:      Appearance: He is well-developed.  HENT:     Head: Normocephalic.     Right Ear: Hearing normal. Drainage, swelling and tenderness present. A middle ear effusion is present. Tympanic membrane is injected, scarred, erythematous and retracted.     Left Ear: Hearing normal.     Nose: Nose normal.  Neck:     Thyroid: No thyroid mass or thyromegaly.     Vascular: No carotid bruit.     Trachea: Trachea normal.  Cardiovascular:     Rate and Rhythm: Normal rate and regular rhythm.     Pulses: Normal pulses.     Heart sounds: Heart sounds not distant. No murmur heard.    No friction rub. No gallop.     Comments: No peripheral edema Pulmonary:  Effort: Pulmonary effort is normal. No respiratory distress.     Breath sounds: Normal breath sounds.  Skin:    General: Skin is warm and dry.     Findings: No rash.  Psychiatric:        Speech: Speech normal.        Behavior: Behavior normal.        Thought Content: Thought content normal.       Results for orders placed or performed during the hospital encounter of 02/13/22  POCT rapid strep A  Result Value Ref Range   Rapid Strep A Screen Positive (A) Negative    Assessment and Plan  Acute ear pain, right  Non-recurrent acute suppurative otitis media of right ear without spontaneous rupture of tympanic membrane  Other orders -     Amoxicillin; Take 2 capsules (1,000 mg total) by mouth 2 (two) times daily.  Dispense: 40 capsule; Refill: 0 -     Ofloxacin; Place 5 drops into the right ear daily.  Dispense: 5 mL; Refill:  0   Significant external canal redness swelling as well as middle ear suppurative effusion.  Will treat with amoxicillin 2 capsules twice daily x 10 days as well as ofloxacin 5 drops in right ear daily.  He can use Advil 800 mg p.o. 3 times daily as needed pain. He will let me know if symptoms are not improving as expected or if he has fever on antibiotics.  Or if he is intolerant of antibiotics. Return and ER precautions provided. No follow-ups on file.   Kerby Nora, MD

## 2023-01-26 ENCOUNTER — Telehealth: Payer: BC Managed Care – PPO | Admitting: Family

## 2023-01-26 DIAGNOSIS — M545 Low back pain, unspecified: Secondary | ICD-10-CM | POA: Diagnosis not present

## 2023-01-26 MED ORDER — BACLOFEN 10 MG PO TABS: 10.0000 mg | ORAL_TABLET | Freq: Three times a day (TID) | ORAL | 0 refills | Status: AC

## 2023-01-26 MED ORDER — NAPROXEN 500 MG PO TABS: 500.0000 mg | ORAL_TABLET | Freq: Two times a day (BID) | ORAL | 0 refills | Status: AC

## 2023-01-26 NOTE — Progress Notes (Signed)
E-Visit for Back Pain   We are sorry that you are not feeling well.  Here is how we plan to help!  Based on what you have shared with me it looks like you mostly have acute back pain.  Acute back pain is defined as musculoskeletal pain that can resolve in 1-3 weeks with conservative treatment.  I have prescribed Naprosyn 500 mg take one by mouth twice a day non-steroid anti-inflammatory (NSAID) as well as Baclofen 10 mg every eight hours as needed which is a muscle relaxer  Some patients experience stomach irritation or in increased heartburn with anti-inflammatory drugs.  Please keep in mind that muscle relaxer's can cause fatigue and should not be taken while at work or driving.  Back pain is very common.  The pain often gets better over time.  The cause of back pain is usually not dangerous.  Most people can learn to manage their back pain on their own.  Home Care Stay active.  Start with short walks on flat ground if you can.  Try to walk farther each day. Do not sit, drive or stand in one place for more than 30 minutes.  Do not stay in bed. Do not avoid exercise or work.  Activity can help your back heal faster. Be careful when you bend or lift an object.  Bend at your knees, keep the object close to you, and do not twist. Sleep on a firm mattress.  Lie on your side, and bend your knees.  If you lie on your back, put a pillow under your knees. Only take medicines as told by your doctor. Put ice on the injured area. Put ice in a plastic bag Place a towel between your skin and the bag Leave the ice on for 15-20 minutes, 3-4 times a day for the first 2-3 days. 210 After that, you can switch between ice and heat packs. Ask your doctor about back exercises or massage. Avoid feeling anxious or stressed.  Find good ways to deal with stress, such as exercise.  Get Help Right Way If: Your pain does not go away with rest or medicine. Your pain does not go away in 1 week. You have new  problems. You do not feel well. The pain spreads into your legs. You cannot control when you poop (bowel movement) or pee (urinate) You feel sick to your stomach (nauseous) or throw up (vomit) You have belly (abdominal) pain. You feel like you may pass out (faint). If you develop a fever.  Make Sure you: Understand these instructions. Will watch your condition Will get help right away if you are not doing well or get worse.  Your e-visit answers were reviewed by a board certified advanced clinical practitioner to complete your personal care plan.  Depending on the condition, your plan could have included both over the counter or prescription medications.  If there is a problem please reply  once you have received a response from your provider.  Your safety is important to [place].  If you have drug allergies check your prescription carefully.    You can use MyChart to ask questions about today's visit, request a non-urgent call back, or ask for a work or school excuse for 24 hours related to this e-Visit. If it has been greater than 24 hours you will need to follow up with your provider, or enter a new e-Visit to address those concerns.  You will get an e-mail in the next two days asking about  your experience.  I hope that your e-visit has been valuable and will speed your recovery. Thank you for using e-visits.   Approximately 5 minutes was spent documenting and reviewing patient's chart.
